# Patient Record
Sex: Female | Born: 2000 | Race: Black or African American | Hispanic: No | Marital: Single | Smoking: Never smoker
Health system: Southern US, Community
[De-identification: ages and names within clinical notes are randomized; demographics above are authoritative.]

## PROBLEM LIST (undated history)

## (undated) DIAGNOSIS — D649 Anemia, unspecified: Secondary | ICD-10-CM

## (undated) HISTORY — PX: WISDOM TOOTH EXTRACTION: SHX21

---
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
## 2023-05-23 IMAGING — DX DG CHEST 1V PORT
1 series · 1 of 1 positions shown · non-contrast
Comparison: Portable exam 6764 hours without priors for comparison

CLINICAL DATA: Nausea and vomiting, generalized abdominal pain

EXAM:
PORTABLE CHEST 1 VIEW

[chest ap]
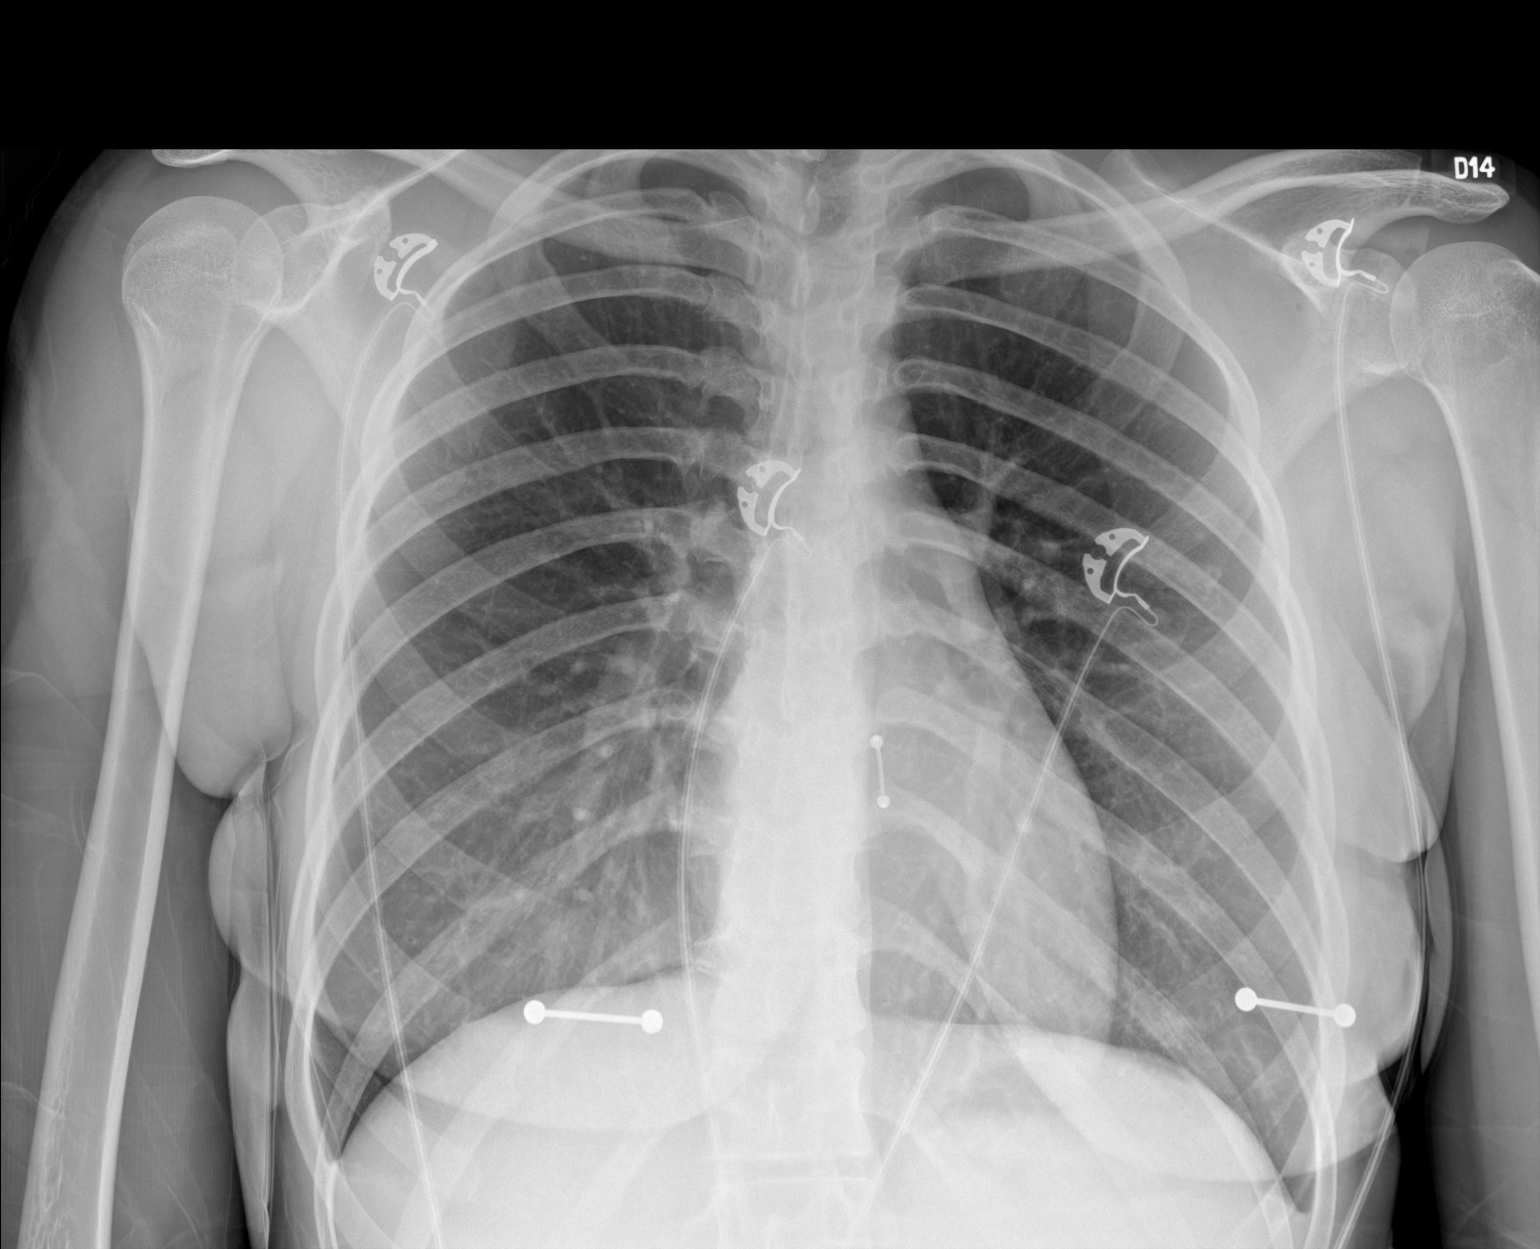

[1 of 1 positions shown; findings below may reference images not displayed]

FINDINGS: Normal heart size, mediastinal contours, and pulmonary vascularity.

Lungs clear.

No pulmonary infiltrate, pleural effusion, or pneumothorax.

Osseous structures unremarkable.
IMPRESSION: No acute abnormalities.

## 2024-11-03 ENCOUNTER — Other Ambulatory Visit: Payer: Self-pay

## 2024-11-03 ENCOUNTER — Emergency Department (HOSPITAL_COMMUNITY)
Admission: EM | Admit: 2024-11-03 | Discharge: 2024-11-04 | Disposition: A | Payer: Self-pay | Attending: Emergency Medicine | Admitting: Emergency Medicine

## 2024-11-03 ENCOUNTER — Encounter (HOSPITAL_COMMUNITY): Payer: Self-pay

## 2024-11-03 DIAGNOSIS — R112 Nausea with vomiting, unspecified: Secondary | ICD-10-CM

## 2024-11-03 HISTORY — DX: Anemia, unspecified: D64.9

## 2024-11-03 LAB — CBC WITH DIFFERENTIAL/PLATELET
Abs Immature Granulocytes: 0.03 K/uL (ref 0.00–0.07)
Basophils Absolute: 0 K/uL (ref 0.0–0.1)
Basophils Relative: 1 %
Eosinophils Absolute: 0 K/uL (ref 0.0–0.5)
Eosinophils Relative: 0 %
HCT: 39.3 % (ref 36.0–46.0)
Hemoglobin: 12.2 g/dL (ref 12.0–15.0)
Immature Granulocytes: 0 %
Lymphocytes Relative: 26 %
Lymphs Abs: 2.2 K/uL (ref 0.7–4.0)
MCH: 24.7 pg — ABNORMAL LOW (ref 26.0–34.0)
MCHC: 31 g/dL (ref 30.0–36.0)
MCV: 79.6 fL — ABNORMAL LOW (ref 80.0–100.0)
Monocytes Absolute: 0.4 K/uL (ref 0.1–1.0)
Monocytes Relative: 4 %
Neutro Abs: 5.8 K/uL (ref 1.7–7.7)
Neutrophils Relative %: 69 %
Platelets: 350 K/uL (ref 150–400)
RBC: 4.94 MIL/uL (ref 3.87–5.11)
RDW: 16.8 % — ABNORMAL HIGH (ref 11.5–15.5)
WBC: 8.4 K/uL (ref 4.0–10.5)
nRBC: 0 % (ref 0.0–0.2)

## 2024-11-03 LAB — COMPREHENSIVE METABOLIC PANEL WITH GFR
ALT: 12 U/L (ref 0–44)
AST: 21 U/L (ref 15–41)
Albumin: 3.8 g/dL (ref 3.5–5.0)
Alkaline Phosphatase: 55 U/L (ref 38–126)
Anion gap: 11 (ref 5–15)
BUN: 8 mg/dL (ref 6–20)
CO2: 22 mmol/L (ref 22–32)
Calcium: 8.9 mg/dL (ref 8.9–10.3)
Chloride: 105 mmol/L (ref 98–111)
Creatinine, Ser: 0.62 mg/dL (ref 0.44–1.00)
GFR, Estimated: >60 mL/min (ref >60)
Glucose, Bld: 94 mg/dL (ref 70–99)
Potassium: 3.4 mmol/L — ABNORMAL LOW (ref 3.5–5.1)
Sodium: 138 mmol/L (ref 135–145)
Total Bilirubin: 0.5 mg/dL (ref 0.0–1.2)
Total Protein: 7.8 g/dL (ref 6.5–8.1)

## 2024-11-03 LAB — RESP PANEL BY RT-PCR (RSV, FLU A&B, COVID)  RVPGX2
Influenza A by PCR: NEGATIVE
Influenza B by PCR: NEGATIVE
Resp Syncytial Virus by PCR: NEGATIVE
SARS Coronavirus 2 by RT PCR: NEGATIVE

## 2024-11-03 LAB — URINALYSIS, ROUTINE W REFLEX MICROSCOPIC
Bacteria, UA: NONE SEEN
Bilirubin Urine: NEGATIVE
Glucose, UA: NEGATIVE mg/dL
Hgb urine dipstick: NEGATIVE
Ketones, ur: 5 mg/dL — AB
Leukocytes,Ua: NEGATIVE
Nitrite: NEGATIVE
Protein, ur: 30 mg/dL — AB
Specific Gravity, Urine: 1.027 (ref 1.005–1.030)
pH: 7 (ref 5.0–8.0)

## 2024-11-03 LAB — HCG, SERUM, QUALITATIVE: Preg, Serum: NEGATIVE

## 2024-11-03 MED ORDER — ONDANSETRON 4 MG PO TBDP
4.0000 mg | ORAL_TABLET | Freq: Once | ORAL | Status: AC
  Administered 2024-11-03: 4 mg via ORAL
  Filled 2024-11-03: qty 1

## 2024-11-03 MED ORDER — ONDANSETRON 4 MG PO TBDP
ORAL_TABLET | ORAL | Status: DC
  Filled 2024-11-03: qty 1

## 2024-11-03 NOTE — ED Triage Notes (Signed)
 POV/ with mother/ N/V since 10am/ c/o dizziness/ not being able to keep fluids down

## 2024-11-03 NOTE — ED Notes (Addendum)
 Mother of pt had to be asked to sit down or leave the room when she stood behind this nurse while this nurse was on the computer adding orders for pt/ mother was telling this nurse what to order and why I'm not ordering other tests. Mother became irate when asked to not stand behind this nurse. Security was made aware./ mother also yelled at staff for moving wheelchair away from bathroom/ this nurse and other staff members explained that the wheelchair was blocking the door and had to be moved.

## 2024-11-04 MED ORDER — SODIUM CHLORIDE 0.9 % IV BOLUS
1000.0000 mL | Freq: Once | INTRAVENOUS | Status: AC
  Administered 2024-11-04: 1000 mL via INTRAVENOUS

## 2024-11-04 MED ORDER — MECLIZINE HCL 25 MG PO TABS
25.0000 mg | ORAL_TABLET | Freq: Once | ORAL | Status: DC
  Filled 2024-11-04: qty 1

## 2024-11-04 MED ORDER — ONDANSETRON 4 MG PO TBDP: 4.0000 mg | ORAL_TABLET | Freq: Three times a day (TID) | ORAL | 0 refills | Status: AC | PRN

## 2024-11-04 NOTE — ED Provider Notes (Signed)
 [place] EMERGENCY DEPARTMENT AT [facility] Provider Note   CSN: [id number] Arrival date & time: 11/03/24  2113     Patient presents with: Vomiting   Stacie Valenzuela is a 23 y.o. female.  Patient with past medical history significant for anemia presents to the emergency department complaining of nausea and vomiting which began at 10 AM this morning.  Patient also complains of some lightheadedness which she attributes to not being able to drink any fluid throughout the day.  She denies abdominal pain, chest pain, shortness of breath, fever, vaginal discharge, urinary symptoms, diarrhea.  Patient works as a production assistant, radio in [employer] and states she is exposed to multiple people throughout the day.  She has no specific sick contacts   HPI     Prior to Admission medications   Medication Sig Start Date End Date Taking? Authorizing Provider  ondansetron  (ZOFRAN -ODT) 4 MG disintegrating tablet Take 1 tablet (4 mg total) by mouth every 8 (eight) hours as needed for nausea or vomiting. 11/04/24  Yes Logan Ubaldo NOVAK, PA-C    Allergies: Patient has no known allergies.    Review of Systems  Updated Vital Signs BP 103/71   Pulse 70   Temp 98.5 F (36.9 C) (Oral)   Resp 13   Wt 73.5 kg   LMP 11/01/2024 (Exact Date)   SpO2 100%   BMI 26.96 kg/m   Physical Exam Vitals and nursing note reviewed.  Constitutional:      General: She is not in acute distress.    Appearance: She is well-developed.  HENT:     Head: Normocephalic and atraumatic.  Eyes:     General: No scleral icterus.    Conjunctiva/sclera: Conjunctivae normal.  Cardiovascular:     Rate and Rhythm: Normal rate and regular rhythm.     Heart sounds: No murmur heard. Pulmonary:     Effort: Pulmonary effort is normal. No respiratory distress.     Breath sounds: Normal breath sounds.  Abdominal:     Palpations: Abdomen is soft.     Tenderness: There is no abdominal tenderness.  Musculoskeletal:         General: No swelling.     Cervical back: Neck supple.  Skin:    General: Skin is warm and dry.     Capillary Refill: Capillary refill takes less than 2 seconds.  Neurological:     Mental Status: She is alert.  Psychiatric:        Mood and Affect: Mood normal.     (all labs ordered are listed, but only abnormal results are displayed) Labs Reviewed  CBC WITH DIFFERENTIAL/PLATELET - Abnormal; Notable for the following components:      Result Value   MCV 79.6 (*)    MCH 24.7 (*)    RDW 16.8 (*)    All other components within normal limits  COMPREHENSIVE METABOLIC PANEL WITH GFR - Abnormal; Notable for the following components:   Potassium 3.4 (*)    All other components within normal limits  URINALYSIS, ROUTINE W REFLEX MICROSCOPIC - Abnormal; Notable for the following components:   Ketones, ur 5 (*)    Protein, ur 30 (*)    All other components within normal limits  RESP PANEL BY RT-PCR (RSV, FLU A&B, COVID)  RVPGX2  HCG, SERUM, QUALITATIVE    EKG: None  Radiology: No results found.   Procedures   Medications Ordered in the ED  ondansetron  (ZOFRAN -ODT) 4 MG disintegrating tablet (0 mg  Hold  11/03/24 2201)  meclizine  (ANTIVERT ) tablet 25 mg (25 mg Oral Not Given 11/04/24 0342)  ondansetron  (ZOFRAN -ODT) disintegrating tablet 4 mg (4 mg Oral Given 11/03/24 2153)  sodium chloride  0.9 % bolus 1,000 mL (0 mLs Intravenous Stopped 11/04/24 0308)                                    Medical Decision Making Risk Prescription drug management.   This patient presents to the ED for concern of nausea and vomiting, this involves an extensive number of treatment options, and is a complaint that carries with it a high risk of complications and morbidity.  The differential diagnosis includes viral illness, gastritis, cholecystitis, pancreatitis, others   Co morbidities / Chronic conditions that complicate the patient evaluation  Anemia   Lab Tests:  I Ordered, and personally  interpreted labs.  The pertinent results include: Grossly unremarkable CMP, CBC, negative pregnancy test, negative respiratory panel   Imaging Studies ordered:  Patient with no significant laboratory abnormalities.  She has no abdominal tenderness on exam.  There is no sign of surgical/acute abdomen at this time.  There is no indication for emergent abdominal imaging   Problem List / ED Course / Critical interventions / Medication management   I ordered medication including Zofran , saline bolus Reevaluation of the patient after these medicines showed that the patient improved I have reviewed the patients home medicines and have made adjustments as needed   Test / Admission - Considered:  Patient feeling much better after fluids and Zofran .  Plan to discharge with prescription for Zofran .  Patient tolerating oral intake.  She is steady on her feet and ambulating without difficulty.  She has no abdominal tenderness to suggest an acute abdominal etiology such as cholecystitis, pancreatitis, appendicitis.  Labs are reassuring.  No indication for admission.  Patient stable for discharge home.      Final diagnoses:  Nausea and vomiting, unspecified vomiting type    ED Discharge Orders          Ordered    ondansetron  (ZOFRAN -ODT) 4 MG disintegrating tablet  Every 8 hours PRN        11/04/24 0151               Logan Ubaldo NOVAK, PA-C 11/04/24 0345    Theadore Ozell HERO, MD 11/04/24 [phone]

## 2024-11-04 NOTE — Discharge Instructions (Signed)
 Your workup this evening was reassuring.  I have prescribed Zofran  to help with your nausea.  You may take this as directed.  If you develop severe abdominal pain or other life-threatening symptoms please return to the emergency department
